# Patient Record
Sex: Male | Born: 2000 | Race: Black or African American | Hispanic: No | Marital: Single
Health system: Southern US, Community
[De-identification: ages and names within clinical notes are randomized; demographics above are authoritative.]

---
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
## 2019-10-19 ENCOUNTER — Ambulatory Visit: Payer: Medicaid Other | Attending: Internal Medicine

## 2019-10-19 DIAGNOSIS — Z23 Encounter for immunization: Secondary | ICD-10-CM

## 2019-10-19 NOTE — Progress Notes (Signed)
   Covid-19 Vaccination Clinic  Name:  WILVER TIGNOR    MRN: [id number] DOB: Oct 11, 2000  10/19/2019  Mr. Gaulin was observed post Covid-19 immunization for 15 minutes without incident. He was provided with Vaccine Information Sheet and instruction to access the V-Safe system.   Mr. Bialas was instructed to call 911 with any severe reactions post vaccine: Marland Kitchen Difficulty breathing  . Swelling of face and throat  . A fast heartbeat  . A bad rash all over body  . Dizziness and weakness   Immunizations Administered    Name Date Dose VIS Date Route   Moderna COVID-19 Vaccine 10/19/2019 12:10 PM 0.5 mL 06/27/2019 Intramuscular   Manufacturer: Moderna   Lot: [id number]   NDC: 04599-774-14

## 2019-11-16 ENCOUNTER — Ambulatory Visit: Payer: Medicaid Other | Attending: Internal Medicine

## 2019-11-16 DIAGNOSIS — Z23 Encounter for immunization: Secondary | ICD-10-CM

## 2019-11-16 NOTE — Progress Notes (Signed)
   Covid-19 Vaccination Clinic  Name:  Clarence Lucas    MRN: [id number] DOB: 04/24/2001  11/16/2019  [name] was observed post Covid-19 immunization for 15 minutes without incident. He was provided with Vaccine Information Sheet and instruction to access the V-Safe system.   [name] was instructed to call 911 with any severe reactions post vaccine: Marland Kitchen Difficulty breathing  . Swelling of face and throat  . A fast heartbeat  . A bad rash all over body  . Dizziness and weakness   Immunizations Administered    Name Date Dose VIS Date Route   Moderna COVID-19 Vaccine 11/16/2019 11:59 AM 0.5 mL 06/2019 Intramuscular   Manufacturer: Moderna   Lot: [id number]   NDC: 52074-097-96
